# Patient Record
Sex: Male | Born: 1980 | Race: Black or African American | Hispanic: No | Marital: Single
Health system: Southern US, Community
[De-identification: ages and names within clinical notes are randomized; demographics above are authoritative.]

---
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
## 2001-04-20 ENCOUNTER — Encounter: Payer: Self-pay | Admitting: General Surgery

## 2001-04-20 ENCOUNTER — Inpatient Hospital Stay (HOSPITAL_COMMUNITY): Admission: AC | Admit: 2001-04-20 | Discharge: 2001-04-20 | Payer: Self-pay

## 2001-04-20 ENCOUNTER — Encounter: Payer: Self-pay | Admitting: Emergency Medicine

## 2001-09-01 ENCOUNTER — Emergency Department (HOSPITAL_COMMUNITY): Admission: EM | Admit: 2001-09-01 | Discharge: 2001-09-01 | Payer: Self-pay | Admitting: Emergency Medicine

## 2004-03-21 ENCOUNTER — Emergency Department (HOSPITAL_COMMUNITY): Admission: EM | Admit: 2004-03-21 | Discharge: 2004-03-22 | Payer: Self-pay | Admitting: Emergency Medicine

## 2004-04-30 ENCOUNTER — Emergency Department (HOSPITAL_COMMUNITY): Admission: EM | Admit: 2004-04-30 | Discharge: 2004-04-30 | Payer: Self-pay

## 2016-10-25 ENCOUNTER — Emergency Department (HOSPITAL_COMMUNITY)
Admission: EM | Admit: 2016-10-25 | Discharge: 2016-10-26 | Disposition: A | Discharge status: Home / Self Care | Payer: Self-pay | Attending: Emergency Medicine | Admitting: Emergency Medicine

## 2016-10-25 ENCOUNTER — Encounter (HOSPITAL_COMMUNITY): Payer: Self-pay

## 2016-10-25 ENCOUNTER — Emergency Department (HOSPITAL_COMMUNITY): Payer: Self-pay

## 2016-10-25 DIAGNOSIS — S41112A Laceration without foreign body of left upper arm, initial encounter: Secondary | ICD-10-CM | POA: Insufficient documentation

## 2016-10-25 DIAGNOSIS — Z23 Encounter for immunization: Secondary | ICD-10-CM | POA: Insufficient documentation

## 2016-10-25 DIAGNOSIS — Y929 Unspecified place or not applicable: Secondary | ICD-10-CM | POA: Insufficient documentation

## 2016-10-25 DIAGNOSIS — F17200 Nicotine dependence, unspecified, uncomplicated: Secondary | ICD-10-CM | POA: Insufficient documentation

## 2016-10-25 DIAGNOSIS — Y939 Activity, unspecified: Secondary | ICD-10-CM | POA: Insufficient documentation

## 2016-10-25 DIAGNOSIS — W260XXA Contact with knife, initial encounter: Secondary | ICD-10-CM | POA: Insufficient documentation

## 2016-10-25 DIAGNOSIS — Y999 Unspecified external cause status: Secondary | ICD-10-CM | POA: Insufficient documentation

## 2016-10-25 DIAGNOSIS — T148XXA Other injury of unspecified body region, initial encounter: Secondary | ICD-10-CM

## 2016-10-25 MED ORDER — TETANUS-DIPHTH-ACELL PERTUSSIS 5-2.5-18.5 LF-MCG/0.5 IM SUSP
0.5000 mL | Freq: Once | INTRAMUSCULAR | Status: AC
  Administered 2016-10-25: 0.5 mL via INTRAMUSCULAR
  Filled 2016-10-25: qty 0.5

## 2016-10-25 MED ORDER — LIDOCAINE-EPINEPHRINE (PF) 2 %-1:200000 IJ SOLN
10.0000 mL | Freq: Once | INTRAMUSCULAR | Status: AC
  Administered 2016-10-25: 10 mL
  Filled 2016-10-25: qty 20

## 2016-10-25 NOTE — ED Notes (Signed)
Suture tray at bedside, wound irrigated with sterile saline.
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
## 2016-10-26 NOTE — Discharge Instructions (Signed)

## 2016-11-04 ENCOUNTER — Encounter (HOSPITAL_COMMUNITY): Payer: Self-pay

## 2016-11-04 ENCOUNTER — Emergency Department (HOSPITAL_COMMUNITY)
Admission: EM | Admit: 2016-11-04 | Discharge: 2016-11-04 | Disposition: A | Discharge status: Home / Self Care | Payer: Self-pay | Attending: Dermatology | Admitting: Dermatology

## 2016-11-04 DIAGNOSIS — Z4802 Encounter for removal of sutures: Secondary | ICD-10-CM | POA: Insufficient documentation

## 2016-11-04 DIAGNOSIS — Z5321 Procedure and treatment not carried out due to patient leaving prior to being seen by health care provider: Secondary | ICD-10-CM | POA: Insufficient documentation

## 2016-11-04 NOTE — ED Notes (Signed)
Pt did not respond when called for room 

## 2016-11-04 NOTE — ED Triage Notes (Signed)
Pt states here for stitches to be removed. Pt with no complaints at triage.

## 2016-11-06 ENCOUNTER — Emergency Department (HOSPITAL_COMMUNITY)
Admission: EM | Admit: 2016-11-06 | Discharge: 2016-11-06 | Disposition: A | Discharge status: Home / Self Care | Payer: Self-pay | Attending: Dermatology | Admitting: Dermatology

## 2016-11-06 ENCOUNTER — Encounter (HOSPITAL_COMMUNITY): Payer: Self-pay | Admitting: Emergency Medicine

## 2016-11-06 DIAGNOSIS — Z5321 Procedure and treatment not carried out due to patient leaving prior to being seen by health care provider: Secondary | ICD-10-CM | POA: Insufficient documentation

## 2016-11-06 DIAGNOSIS — F17200 Nicotine dependence, unspecified, uncomplicated: Secondary | ICD-10-CM | POA: Insufficient documentation

## 2016-11-06 DIAGNOSIS — Z4802 Encounter for removal of sutures: Secondary | ICD-10-CM | POA: Insufficient documentation

## 2016-11-06 NOTE — ED Notes (Signed)
Patient not in room

## 2016-11-06 NOTE — ED Notes (Signed)
Pt eloped without informing RN no AMA forms being signed.

## 2016-11-06 NOTE — ED Notes (Signed)
Pt at desk asking to see PA due to needing to be at work. PA made aware. Pt updated of delay. Provider will be in as soon as possible.
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
## 2016-11-19 ENCOUNTER — Encounter (HOSPITAL_COMMUNITY): Payer: Self-pay

## 2016-11-19 ENCOUNTER — Emergency Department (HOSPITAL_COMMUNITY)
Admission: EM | Admit: 2016-11-19 | Discharge: 2016-11-19 | Disposition: A | Discharge status: Home / Self Care | Payer: Self-pay | Attending: Physician Assistant | Admitting: Physician Assistant

## 2016-11-19 DIAGNOSIS — S61451A Open bite of right hand, initial encounter: Secondary | ICD-10-CM | POA: Insufficient documentation

## 2016-11-19 DIAGNOSIS — Y92410 Unspecified street and highway as the place of occurrence of the external cause: Secondary | ICD-10-CM | POA: Insufficient documentation

## 2016-11-19 DIAGNOSIS — T148XXA Other injury of unspecified body region, initial encounter: Secondary | ICD-10-CM

## 2016-11-19 DIAGNOSIS — Z203 Contact with and (suspected) exposure to rabies: Secondary | ICD-10-CM | POA: Insufficient documentation

## 2016-11-19 DIAGNOSIS — S61452A Open bite of left hand, initial encounter: Secondary | ICD-10-CM | POA: Insufficient documentation

## 2016-11-19 DIAGNOSIS — Y9301 Activity, walking, marching and hiking: Secondary | ICD-10-CM | POA: Insufficient documentation

## 2016-11-19 DIAGNOSIS — Y999 Unspecified external cause status: Secondary | ICD-10-CM | POA: Insufficient documentation

## 2016-11-19 DIAGNOSIS — Z23 Encounter for immunization: Secondary | ICD-10-CM | POA: Insufficient documentation

## 2016-11-19 DIAGNOSIS — Z79899 Other long term (current) drug therapy: Secondary | ICD-10-CM | POA: Insufficient documentation

## 2016-11-19 DIAGNOSIS — W540XXA Bitten by dog, initial encounter: Secondary | ICD-10-CM | POA: Insufficient documentation

## 2016-11-19 DIAGNOSIS — F17200 Nicotine dependence, unspecified, uncomplicated: Secondary | ICD-10-CM | POA: Insufficient documentation

## 2016-11-19 MED ORDER — CLINDAMYCIN HCL 150 MG PO CAPS
450.0000 mg | ORAL_CAPSULE | Freq: Once | ORAL | Status: AC
  Administered 2016-11-19: 450 mg via ORAL
  Filled 2016-11-19: qty 3

## 2016-11-19 MED ORDER — CLINDAMYCIN HCL 150 MG PO CAPS: 450.0000 mg | ORAL_CAPSULE | Freq: Three times a day (TID) | ORAL | 0 refills | Status: AC

## 2016-11-19 MED ORDER — RABIES IMMUNE GLOBULIN 150 UNIT/ML IM INJ
20.0000 [IU]/kg | INJECTION | Freq: Once | INTRAMUSCULAR | Status: AC
  Administered 2016-11-19: 1650 [IU] via INTRAMUSCULAR
  Filled 2016-11-19: qty 11

## 2016-11-19 MED ORDER — RABIES VACCINE, PCEC IM SUSR
1.0000 mL | Freq: Once | INTRAMUSCULAR | Status: AC
Start: 2016-11-19 — End: 2016-11-19
  Administered 2016-11-19: 1 mL via INTRAMUSCULAR
  Filled 2016-11-19: qty 1

## 2016-11-19 MED ORDER — CIPROFLOXACIN HCL 500 MG PO TABS: 500.0000 mg | ORAL_TABLET | Freq: Two times a day (BID) | ORAL | 0 refills | Status: AC

## 2016-11-19 MED ORDER — CIPROFLOXACIN HCL 500 MG PO TABS
500.0000 mg | ORAL_TABLET | Freq: Once | ORAL | Status: AC
  Administered 2016-11-19: 500 mg via ORAL
  Filled 2016-11-19: qty 1

## 2016-11-19 NOTE — ED Notes (Signed)
Pt verbalizes understanding of DC teaching and need to follow up for rabies care, pt also verbalizes need to follow up with hand specialist. NAD.
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
## 2016-11-19 NOTE — ED Notes (Signed)
Registration took information for animal control

## 2018-12-09 IMAGING — DX DG HUMERUS 2V *L*
2 series · 2 of 2 positions shown · non-contrast
Comparison: None.

CLINICAL DATA: LEFT upper arm laceration on fence.

EXAM:
LEFT HUMERUS - 2+ VIEW

[humerus ap]
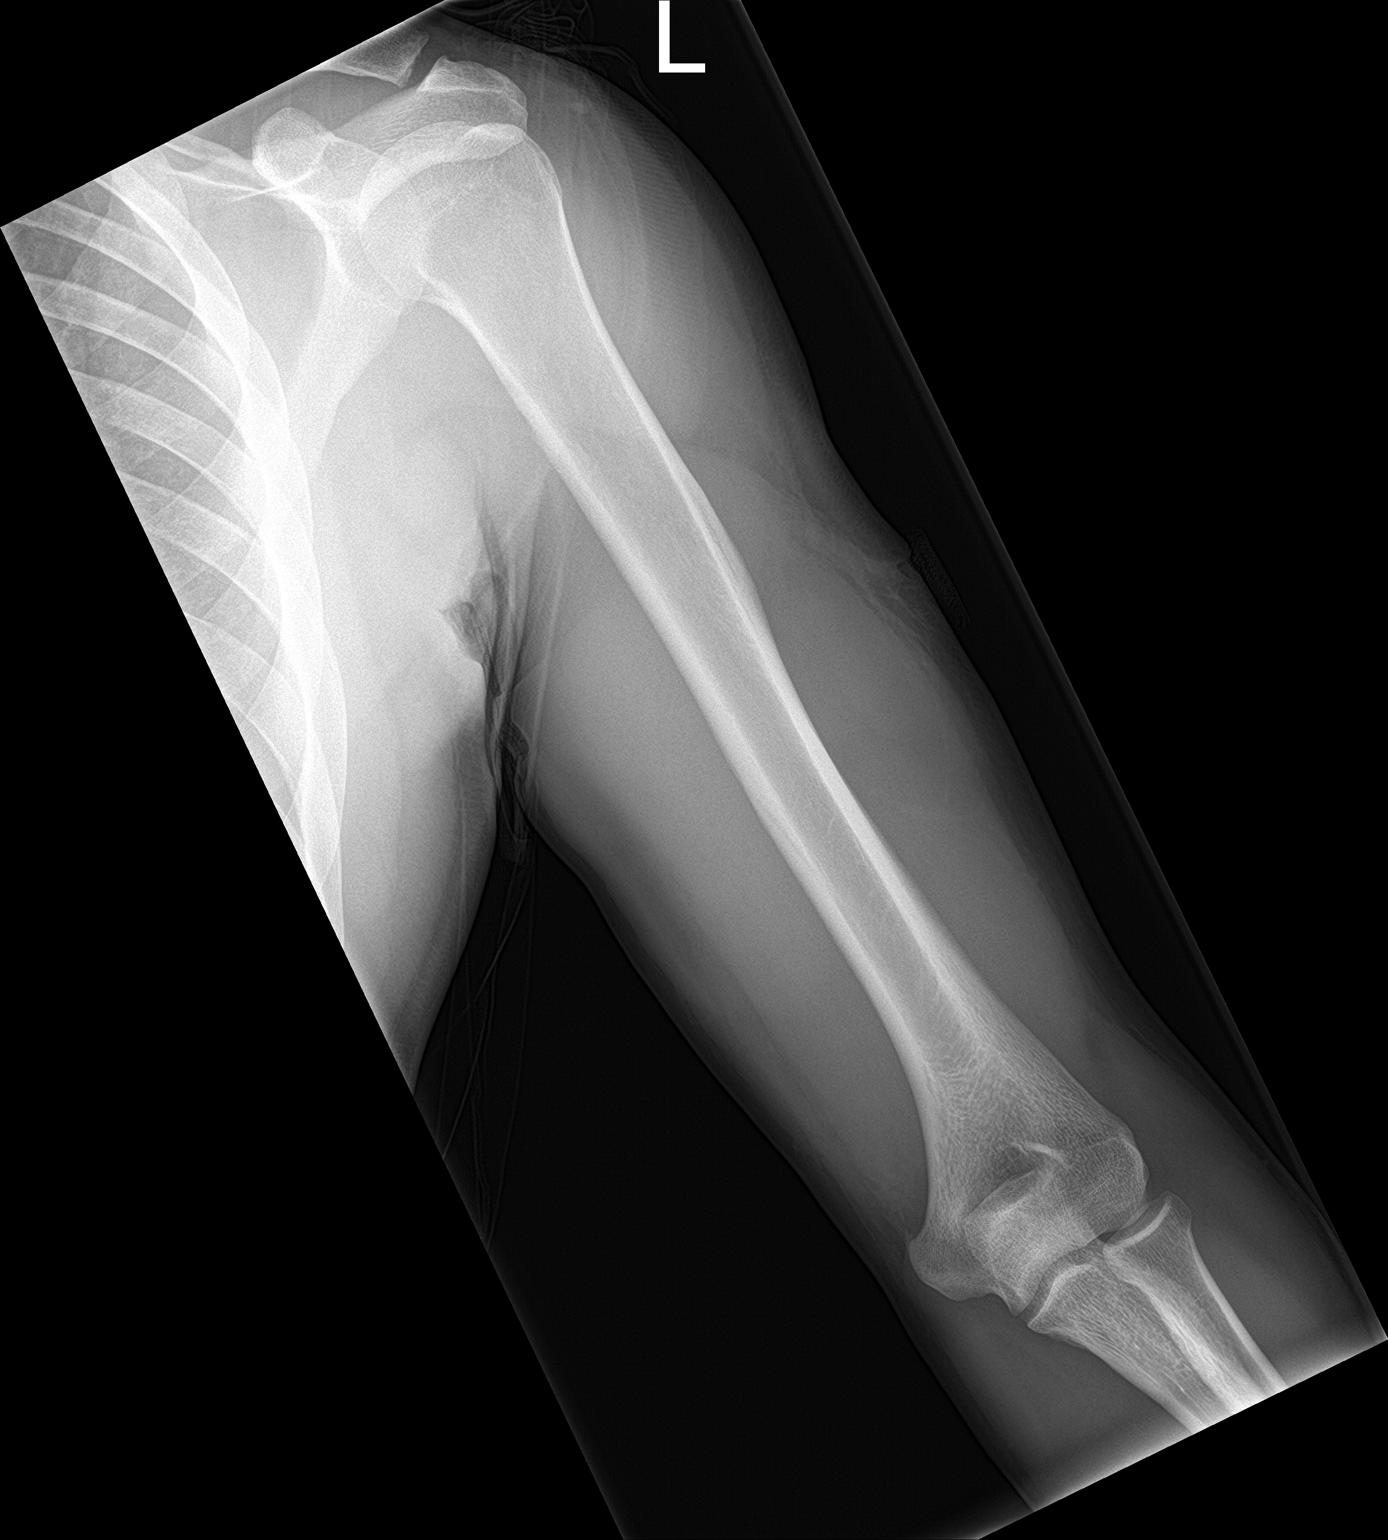

[humerus lat]
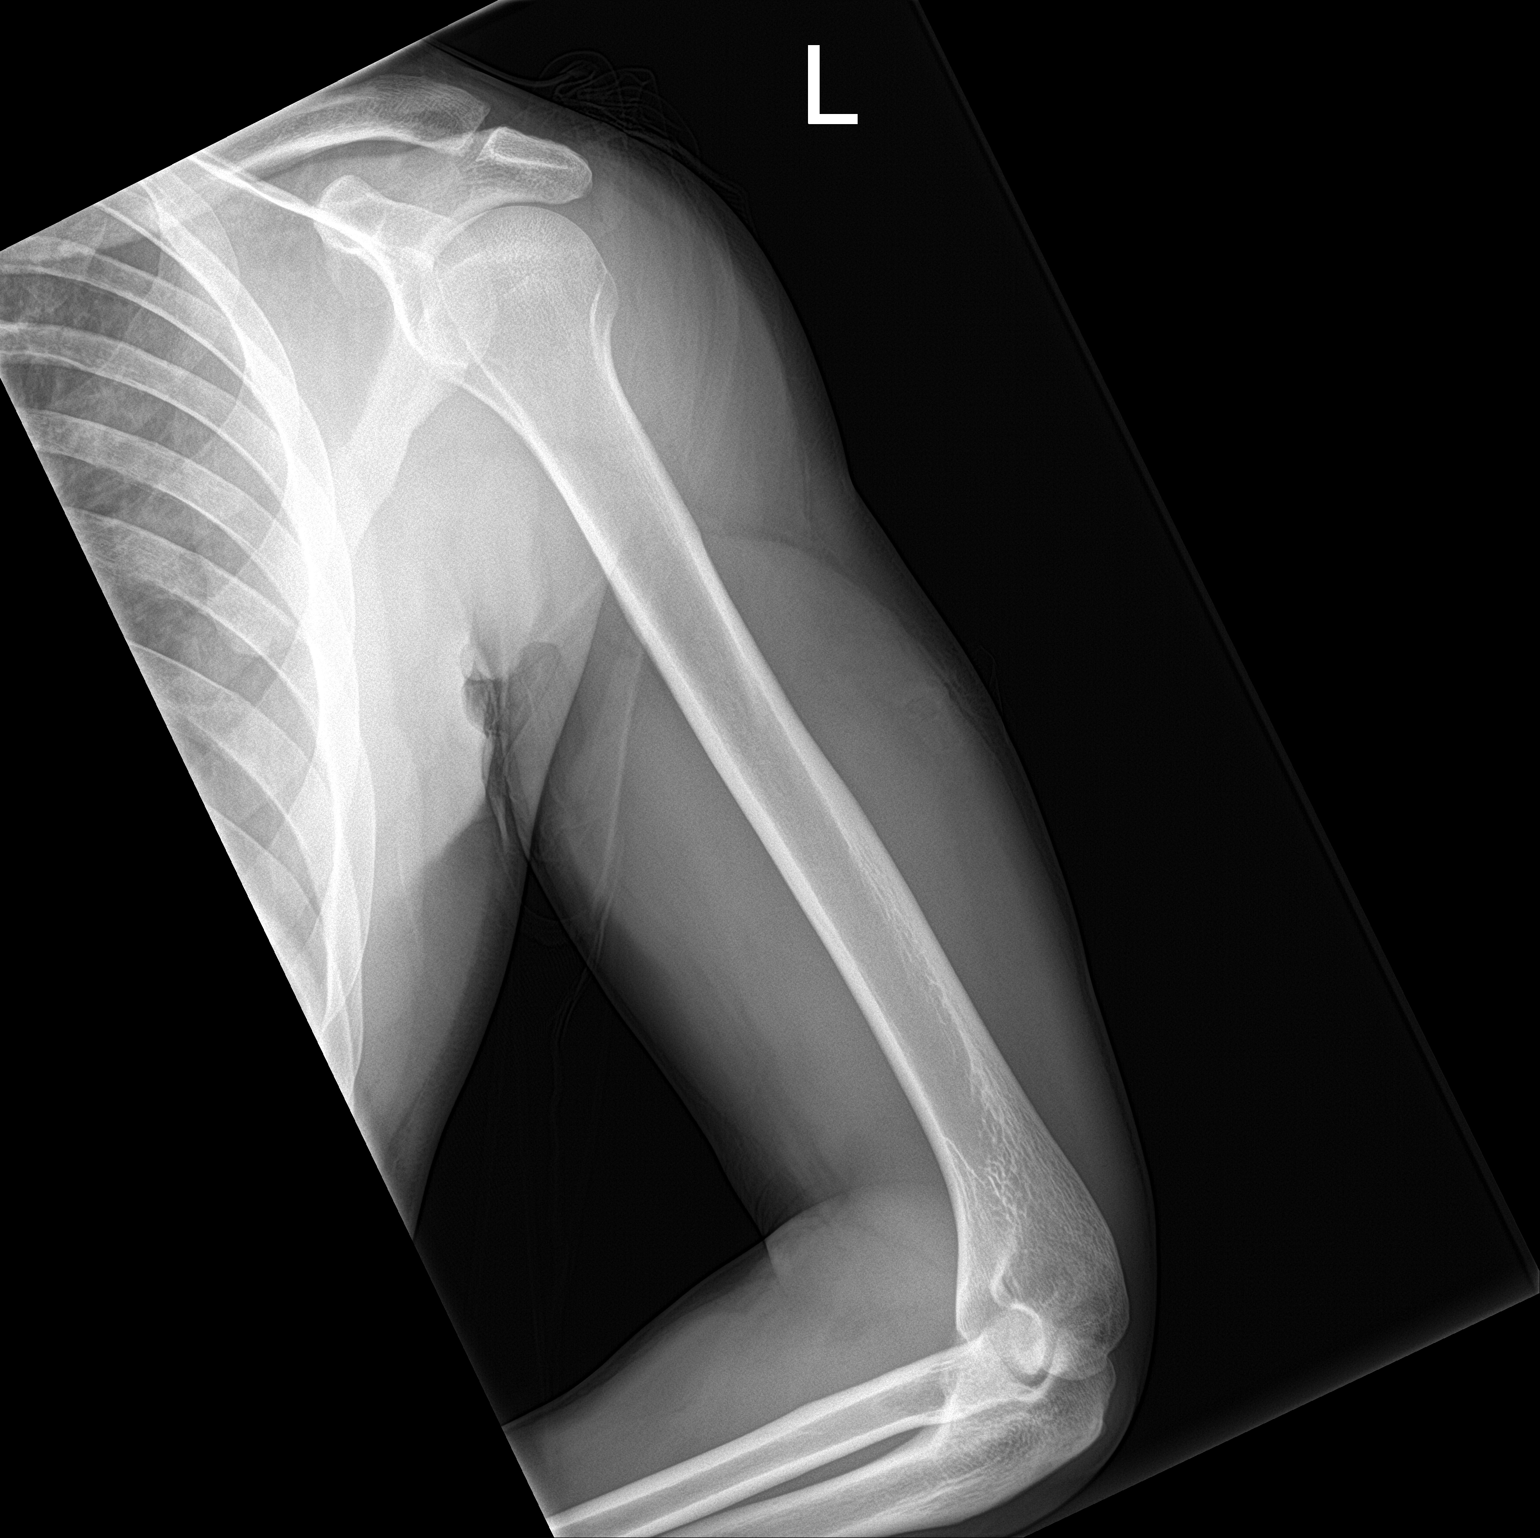

[2 of 2 positions shown; findings below may reference images not displayed]

FINDINGS: There is no evidence of fracture or other focal bone lesions.
Superficial mid lateral humeral soft tissue defect with overlying
bandage, no subcutaneous gas or radiopaque foreign body.
IMPRESSION: Soft tissue defect/laceration without acute process.

## 2022-03-13 ENCOUNTER — Emergency Department (HOSPITAL_COMMUNITY)
Admission: EM | Admit: 2022-03-13 | Discharge: 2022-04-11 | Disposition: E | Payer: Self-pay | Attending: Emergency Medicine | Admitting: Emergency Medicine

## 2022-03-13 ENCOUNTER — Emergency Department (HOSPITAL_COMMUNITY): Payer: Self-pay

## 2022-03-13 ENCOUNTER — Encounter (HOSPITAL_COMMUNITY): Payer: Self-pay

## 2022-03-13 DIAGNOSIS — I468 Cardiac arrest due to other underlying condition: Secondary | ICD-10-CM | POA: Insufficient documentation

## 2022-03-13 DIAGNOSIS — W3400XA Accidental discharge from unspecified firearms or gun, initial encounter: Secondary | ICD-10-CM | POA: Insufficient documentation

## 2022-03-13 DIAGNOSIS — S21131A Puncture wound without foreign body of right front wall of thorax without penetration into thoracic cavity, initial encounter: Secondary | ICD-10-CM | POA: Insufficient documentation

## 2022-03-13 LAB — COMPREHENSIVE METABOLIC PANEL
ALT: 114 U/L — ABNORMAL HIGH (ref 0–44)
AST: 160 U/L — ABNORMAL HIGH (ref 15–41)
Albumin: 2 g/dL — ABNORMAL LOW (ref 3.5–5.0)
Alkaline Phosphatase: 44 U/L (ref 38–126)
Anion gap: 22 — ABNORMAL HIGH (ref 5–15)
BUN: 7 mg/dL (ref 6–20)
CO2: 14 mmol/L — ABNORMAL LOW (ref 22–32)
Calcium: 7.7 mg/dL — ABNORMAL LOW (ref 8.9–10.3)
Chloride: 110 mmol/L (ref 98–111)
Creatinine, Ser: 1.73 mg/dL — ABNORMAL HIGH (ref 0.61–1.24)
GFR, Estimated: 51 mL/min — ABNORMAL LOW (ref >60)
Glucose, Bld: 188 mg/dL — ABNORMAL HIGH (ref 70–99)
Potassium: 4.4 mmol/L (ref 3.5–5.1)
Sodium: 146 mmol/L — ABNORMAL HIGH (ref 135–145)
Total Bilirubin: 0.3 mg/dL (ref 0.3–1.2)
Total Protein: 3.6 g/dL — ABNORMAL LOW (ref 6.5–8.1)

## 2022-03-13 LAB — CBC
HCT: 32.5 % — ABNORMAL LOW (ref 39.0–52.0)
Hemoglobin: 10.2 g/dL — ABNORMAL LOW (ref 13.0–17.0)
MCH: 30.5 pg (ref 26.0–34.0)
MCHC: 31.4 g/dL (ref 30.0–36.0)
MCV: 97.3 fL (ref 80.0–100.0)
Platelets: 116 10*3/uL — ABNORMAL LOW (ref 150–400)
RBC: 3.34 MIL/uL — ABNORMAL LOW (ref 4.22–5.81)
RDW: 14.3 % (ref 11.5–15.5)
WBC: 4.8 10*3/uL (ref 4.0–10.5)
nRBC: 0 % (ref 0.0–0.2)

## 2022-03-13 LAB — ETHANOL: Alcohol, Ethyl (B): 117 mg/dL — ABNORMAL HIGH (ref <10)

## 2022-03-13 MED ORDER — EPINEPHRINE 1 MG/10ML IJ SOSY
PREFILLED_SYRINGE | INTRAMUSCULAR | Status: AC | PRN
  Administered 2022-03-13 (×3): 1 mg via INTRAVENOUS
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
## 2022-04-11 NOTE — ED Notes (Signed)
Warm fluids started 

## 2022-04-11 NOTE — ED Notes (Signed)
Called ME, Coble for MD

## 2022-04-11 NOTE — Code Documentation (Signed)
Patient time of death occurred at 32. Called by MD Eudelia Bunch

## 2022-04-11 NOTE — Progress Notes (Signed)
Orthopedic Tech Progress Note Patient Details:  Nicholas Mcdonald 18-Jun-1981 [id number]  Patient ID: Nicholas Mcdonald, male   DOB: 03/25/1981, 41 y.o.   MRN: [id number] I attended trauma page. Trinna Post 2022/04/09, 1:00 AM
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
## 2022-04-11 NOTE — ED Notes (Signed)
CSI at bedside.

## 2022-04-11 NOTE — Code Documentation (Signed)
Trauma MD opening chest at bedside
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
## 2022-04-11 NOTE — ED Notes (Signed)
Patient placement notified. 
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
## 2022-04-11 DEATH — deceased
